# Patient Record
Sex: Male | Born: 2005 | Race: White | Hispanic: No | Marital: Single | Smoking: Never smoker
Health system: Southern US, Community
[De-identification: ages and names within clinical notes are randomized; demographics above are authoritative.]

## PROBLEM LIST (undated history)

## (undated) DIAGNOSIS — S83519A Sprain of anterior cruciate ligament of unspecified knee, initial encounter: Secondary | ICD-10-CM

## (undated) DIAGNOSIS — Z8489 Family history of other specified conditions: Secondary | ICD-10-CM

---
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
## 2024-09-11 ENCOUNTER — Encounter: Admitting: Internal Medicine
